# Patient Record
Sex: Male | Born: 1960 | Race: White | Hispanic: No | Marital: Single | Smoking: Current every day smoker
Health system: Southern US, Community
[De-identification: ages and names within clinical notes are randomized; demographics above are authoritative.]

## PROBLEM LIST (undated history)

## (undated) DIAGNOSIS — I447 Left bundle-branch block, unspecified: Secondary | ICD-10-CM

## (undated) DIAGNOSIS — I10 Essential (primary) hypertension: Secondary | ICD-10-CM

## (undated) DIAGNOSIS — I2510 Atherosclerotic heart disease of native coronary artery without angina pectoris: Secondary | ICD-10-CM

## (undated) DIAGNOSIS — Z9889 Other specified postprocedural states: Secondary | ICD-10-CM

---

## 2019-08-01 ENCOUNTER — Emergency Department (HOSPITAL_COMMUNITY): Payer: No Typology Code available for payment source

## 2019-08-01 ENCOUNTER — Encounter (HOSPITAL_COMMUNITY): Payer: Self-pay

## 2019-08-01 ENCOUNTER — Emergency Department (HOSPITAL_COMMUNITY)
Admission: EM | Admit: 2019-08-01 | Discharge: 2019-08-01 | Disposition: A | Discharge status: Home / Self Care | Payer: No Typology Code available for payment source | Attending: Emergency Medicine | Admitting: Emergency Medicine

## 2019-08-01 ENCOUNTER — Other Ambulatory Visit: Payer: Self-pay

## 2019-08-01 DIAGNOSIS — I10 Essential (primary) hypertension: Secondary | ICD-10-CM | POA: Insufficient documentation

## 2019-08-01 DIAGNOSIS — S39012A Strain of muscle, fascia and tendon of lower back, initial encounter: Secondary | ICD-10-CM | POA: Diagnosis not present

## 2019-08-01 DIAGNOSIS — Y999 Unspecified external cause status: Secondary | ICD-10-CM | POA: Insufficient documentation

## 2019-08-01 DIAGNOSIS — F17200 Nicotine dependence, unspecified, uncomplicated: Secondary | ICD-10-CM | POA: Insufficient documentation

## 2019-08-01 DIAGNOSIS — S5002XA Contusion of left elbow, initial encounter: Secondary | ICD-10-CM

## 2019-08-01 DIAGNOSIS — S161XXA Strain of muscle, fascia and tendon at neck level, initial encounter: Secondary | ICD-10-CM | POA: Insufficient documentation

## 2019-08-01 DIAGNOSIS — Y939 Activity, unspecified: Secondary | ICD-10-CM | POA: Insufficient documentation

## 2019-08-01 DIAGNOSIS — I2510 Atherosclerotic heart disease of native coronary artery without angina pectoris: Secondary | ICD-10-CM | POA: Insufficient documentation

## 2019-08-01 DIAGNOSIS — Y92410 Unspecified street and highway as the place of occurrence of the external cause: Secondary | ICD-10-CM | POA: Insufficient documentation

## 2019-08-01 DIAGNOSIS — R519 Headache, unspecified: Secondary | ICD-10-CM | POA: Insufficient documentation

## 2019-08-01 DIAGNOSIS — S199XXA Unspecified injury of neck, initial encounter: Secondary | ICD-10-CM | POA: Diagnosis present

## 2019-08-01 HISTORY — DX: Essential (primary) hypertension: I10

## 2019-08-01 HISTORY — DX: Left bundle-branch block, unspecified: I44.7

## 2019-08-01 HISTORY — DX: Other specified postprocedural states: Z98.890

## 2019-08-01 HISTORY — DX: Atherosclerotic heart disease of native coronary artery without angina pectoris: I25.10

## 2019-08-01 MED ORDER — CYCLOBENZAPRINE HCL 10 MG PO TABS: 10.0000 mg | ORAL_TABLET | Freq: Two times a day (BID) | ORAL | 0 refills | Status: AC | PRN

## 2019-08-01 MED ORDER — FENTANYL CITRATE (PF) 100 MCG/2ML IJ SOLN
25.0000 ug | Freq: Once | INTRAMUSCULAR | Status: AC
  Administered 2019-08-01: 25 ug via INTRAVENOUS
  Filled 2019-08-01: qty 2

## 2019-08-01 MED ORDER — NAPROXEN 500 MG PO TABS: 500.0000 mg | ORAL_TABLET | Freq: Two times a day (BID) | ORAL | 0 refills | Status: AC

## 2019-08-01 MED ORDER — CYCLOBENZAPRINE HCL 10 MG PO TABS
10.0000 mg | ORAL_TABLET | Freq: Once | ORAL | Status: AC
  Administered 2019-08-01: 10 mg via ORAL
  Filled 2019-08-01: qty 1

## 2019-08-01 MED ORDER — ONDANSETRON HCL 4 MG/2ML IJ SOLN
4.0000 mg | Freq: Once | INTRAMUSCULAR | Status: AC
  Administered 2019-08-01: 4 mg via INTRAVENOUS
  Filled 2019-08-01: qty 2
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
## 2019-08-01 NOTE — ED Notes (Signed)
Patient Alert and oriented to baseline. Stable and ambulatory to baseline. Patient verbalized understanding of the discharge instructions.  Patient belongings were taken by the patient.   
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
